# Patient Record
Sex: Male | Born: 1995 | Race: White | Hispanic: No | Smoking: Current every day smoker
Health system: Southern US, Community
[De-identification: ages and names within clinical notes are randomized; demographics above are authoritative.]

## PROBLEM LIST (undated history)

## (undated) DIAGNOSIS — R569 Unspecified convulsions: Secondary | ICD-10-CM

## (undated) HISTORY — PX: OTHER SURGICAL HISTORY: SHX169

## (undated) HISTORY — PX: VENTRICULOPERITONEAL SHUNT: SHX204

---

## 2016-10-04 ENCOUNTER — Other Ambulatory Visit: Payer: Self-pay | Admitting: Occupational Medicine

## 2016-10-04 ENCOUNTER — Ambulatory Visit: Payer: Self-pay

## 2016-10-04 DIAGNOSIS — Z0000 Encounter for general adult medical examination without abnormal findings: Secondary | ICD-10-CM

## 2019-11-07 ENCOUNTER — Emergency Department (HOSPITAL_COMMUNITY)

## 2019-11-07 ENCOUNTER — Emergency Department (HOSPITAL_COMMUNITY)
Admission: EM | Admit: 2019-11-07 | Discharge: 2019-11-07 | Attending: Emergency Medicine | Admitting: Emergency Medicine

## 2019-11-07 ENCOUNTER — Encounter (HOSPITAL_COMMUNITY): Payer: Self-pay

## 2019-11-07 ENCOUNTER — Other Ambulatory Visit: Payer: Self-pay

## 2019-11-07 DIAGNOSIS — Z20828 Contact with and (suspected) exposure to other viral communicable diseases: Secondary | ICD-10-CM | POA: Insufficient documentation

## 2019-11-07 DIAGNOSIS — R1084 Generalized abdominal pain: Secondary | ICD-10-CM | POA: Insufficient documentation

## 2019-11-07 DIAGNOSIS — F17210 Nicotine dependence, cigarettes, uncomplicated: Secondary | ICD-10-CM | POA: Insufficient documentation

## 2019-11-07 DIAGNOSIS — R569 Unspecified convulsions: Secondary | ICD-10-CM | POA: Diagnosis not present

## 2019-11-07 DIAGNOSIS — R112 Nausea with vomiting, unspecified: Secondary | ICD-10-CM | POA: Insufficient documentation

## 2019-11-07 DIAGNOSIS — Z982 Presence of cerebrospinal fluid drainage device: Secondary | ICD-10-CM | POA: Insufficient documentation

## 2019-11-07 HISTORY — DX: Unspecified convulsions: R56.9

## 2019-11-07 LAB — CBC WITH DIFFERENTIAL/PLATELET
Abs Immature Granulocytes: 0.05 10*3/uL (ref 0.00–0.07)
Basophils Absolute: 0.1 10*3/uL (ref 0.0–0.1)
Basophils Relative: 1 %
Eosinophils Absolute: 0.1 10*3/uL (ref 0.0–0.5)
Eosinophils Relative: 1 %
HCT: 48.1 % (ref 39.0–52.0)
Hemoglobin: 16.1 g/dL (ref 13.0–17.0)
Immature Granulocytes: 0 %
Lymphocytes Relative: 30 %
Lymphs Abs: 4 10*3/uL (ref 0.7–4.0)
MCH: 30.5 pg (ref 26.0–34.0)
MCHC: 33.5 g/dL (ref 30.0–36.0)
MCV: 91.1 fL (ref 80.0–100.0)
Monocytes Absolute: 0.8 10*3/uL (ref 0.1–1.0)
Monocytes Relative: 6 %
Neutro Abs: 8.3 10*3/uL — ABNORMAL HIGH (ref 1.7–7.7)
Neutrophils Relative %: 62 %
Platelets: 396 10*3/uL (ref 150–400)
RBC: 5.28 MIL/uL (ref 4.22–5.81)
RDW: 13.3 % (ref 11.5–15.5)
WBC: 13.3 10*3/uL — ABNORMAL HIGH (ref 4.0–10.5)
nRBC: 0 % (ref 0.0–0.2)

## 2019-11-07 LAB — COMPREHENSIVE METABOLIC PANEL
ALT: 40 U/L (ref 0–44)
AST: 20 U/L (ref 15–41)
Albumin: 4.2 g/dL (ref 3.5–5.0)
Alkaline Phosphatase: 66 U/L (ref 38–126)
Anion gap: 12 (ref 5–15)
BUN: 16 mg/dL (ref 6–20)
CO2: 21 mmol/L — ABNORMAL LOW (ref 22–32)
Calcium: 9.3 mg/dL (ref 8.9–10.3)
Chloride: 104 mmol/L (ref 98–111)
Creatinine, Ser: 0.75 mg/dL (ref 0.61–1.24)
GFR calc Af Amer: >60 mL/min (ref >60)
GFR calc non Af Amer: >60 mL/min (ref >60)
Glucose, Bld: 97 mg/dL (ref 70–99)
Potassium: 3.7 mmol/L (ref 3.5–5.1)
Sodium: 137 mmol/L (ref 135–145)
Total Bilirubin: 0.7 mg/dL (ref 0.3–1.2)
Total Protein: 8.4 g/dL — ABNORMAL HIGH (ref 6.5–8.1)

## 2019-11-07 LAB — MAGNESIUM: Magnesium: 2.2 mg/dL (ref 1.7–2.4)

## 2019-11-07 LAB — LIPASE, BLOOD: Lipase: 38 U/L (ref 11–51)

## 2019-11-07 MED ORDER — ONDANSETRON 4 MG PO TBDP: 4.0000 mg | ORAL_TABLET | Freq: Three times a day (TID) | ORAL | 1 refills | Status: AC | PRN

## 2019-11-07 MED ORDER — SODIUM CHLORIDE 0.9 % IV BOLUS
1000.0000 mL | Freq: Once | INTRAVENOUS | Status: AC
  Administered 2019-11-07: 1000 mL via INTRAVENOUS

## 2019-11-07 MED ORDER — ONDANSETRON HCL 4 MG/2ML IJ SOLN
4.0000 mg | Freq: Once | INTRAMUSCULAR | Status: AC
  Administered 2019-11-07: 4 mg via INTRAVENOUS
  Filled 2019-11-07: qty 2

## 2019-11-07 MED ORDER — SODIUM CHLORIDE 0.9 % IV SOLN
INTRAVENOUS | Status: DC
  Administered 2019-11-07: 17:00:00 via INTRAVENOUS

## 2019-11-07 MED ORDER — IOHEXOL 300 MG/ML  SOLN
100.0000 mL | Freq: Once | INTRAMUSCULAR | Status: AC | PRN
  Administered 2019-11-07: 100 mL via INTRAVENOUS

## 2019-11-07 NOTE — Discharge Instructions (Signed)
Work-up in the emergency department clued CT head CT abdomen pelvis labs.  Also retested for Covid.  Results still pending.  No significant abnormalities patient feeling better.  Patient able to eat some snacks.  Would recommend continuing some antinausea medicine like Zofran.  Apparently had some type of seizure-like activity.  But is fine here.  If he has recurrent seizures he will need to be reevaluated.

## 2019-11-07 NOTE — ED Provider Notes (Signed)
[facility] EMERGENCY DEPARTMENT Provider Note   CSN: [id number] Arrival date & time: 11/07/19  1556     History   Chief Complaint Chief Complaint  Patient presents with  . Seizures    HPI Todd Beck is a 23 y.o. male.     Patient brought in from [place] [facility].  Patient's been in the jail for the past 4 days but he was transferred from another facility.  Patient reports nausea vomiting and some abdominal discomfort states had nausea and vomiting starting yesterday.  And supposedly had 6 back-to-back seizures.  Patient stated last time he had a seizure to me was many years ago.  Patient as a child had history of hydrocephalus.  And does have a shunt.  He denies any fevers.  Any significant headache.  Denies biting his tongue.  Denies any incontinence.  Main complaint currently is nausea.  Patient had a negative Covid test about 2 weeks ago.  But none recently.     Past Medical History:  Diagnosis Date  . Seizures (HCC)     There are no active problems to display for this patient.   Past Surgical History:  Procedure Laterality Date  . uv shunt     12 revisions        Home Medications    Prior to Admission medications   Medication Sig Start Date End Date Taking? Authorizing Provider  ondansetron (ZOFRAN ODT) 4 MG disintegrating tablet Take 1 tablet (4 mg total) by mouth every 8 (eight) hours as needed. 11/07/19   Vanetta Mulders, MD    Family History No family history on file.  Social History Social History   Tobacco Use  . Smoking status: Current Every Day Smoker  . Smokeless tobacco: Never Used  Substance Use Topics  . Alcohol use: Never    Frequency: Never  . Drug use: Yes    Types: IV    Comment: heroin     Allergies   Penicillins and Zithromax [azithromycin]   Review of Systems Review of Systems  Constitutional: Negative for chills and fever.  HENT: Negative for congestion, rhinorrhea and sore throat.   Eyes: Negative for visual  disturbance.  Respiratory: Negative for cough and shortness of breath.   Cardiovascular: Negative for chest pain and leg swelling.  Gastrointestinal: Positive for abdominal pain, nausea and vomiting. Negative for diarrhea.  Genitourinary: Negative for dysuria.  Musculoskeletal: Negative for back pain and neck pain.  Skin: Negative for rash.  Neurological: Positive for seizures. Negative for dizziness, light-headedness and headaches.  Hematological: Does not bruise/bleed easily.  Psychiatric/Behavioral: Negative for confusion.     Physical Exam Updated Vital Signs BP 129/72   Pulse 75   Temp 98.1 F (36.7 C) (Oral)   Resp 18   Ht 1.829 m (6')   Wt 72.6 kg   SpO2 100%   BMI 21.70 kg/m   Physical Exam Vitals signs and nursing note reviewed.  Constitutional:      Appearance: Normal appearance. He is well-developed.  HENT:     Head: Normocephalic and atraumatic.     Mouth/Throat:     Mouth: Mucous membranes are dry.  Eyes:     Extraocular Movements: Extraocular movements intact.     Conjunctiva/sclera: Conjunctivae normal.     Pupils: Pupils are equal, round, and reactive to light.  Neck:     Musculoskeletal: Normal range of motion and neck supple.  Cardiovascular:     Rate and Rhythm: Normal rate and regular rhythm.  Heart sounds: No murmur.  Pulmonary:     Effort: Pulmonary effort is normal. No respiratory distress.     Breath sounds: Normal breath sounds.  Abdominal:     Palpations: Abdomen is soft.     Tenderness: There is abdominal tenderness.     Comments: Mild diffuse tenderness little increased right lower quadrant.  Musculoskeletal: Normal range of motion.        General: No swelling.  Skin:    General: Skin is warm and dry.     Capillary Refill: Capillary refill takes less than 2 seconds.  Neurological:     Mental Status: He is alert and oriented to person, place, and time.     Cranial Nerves: No cranial nerve deficit.     Sensory: No sensory deficit.      Motor: No weakness.     Coordination: Coordination normal.     Gait: Gait normal.      ED Treatments / Results  Labs (all labs ordered are listed, but only abnormal results are displayed) Labs Reviewed  CBC WITH DIFFERENTIAL/PLATELET - Abnormal; Notable for the following components:      Result Value   WBC 13.3 (*)    Neutro Abs 8.3 (*)    All other components within normal limits  COMPREHENSIVE METABOLIC PANEL - Abnormal; Notable for the following components:   CO2 21 (*)    Total Protein 8.4 (*)    All other components within normal limits  SARS CORONAVIRUS 2 (TAT 6-24 HRS)  MAGNESIUM  LIPASE, BLOOD    EKG None  Radiology Ct Head Wo Contrast  Result Date: 11/07/2019 CLINICAL DATA:  Nontraumatic seizure, has been in jail for past 4 days, nausea, vomiting then seizure, past history of seizures most recently 1 month ago, history IV drug abuse last 4 days ago, history of shunt, smoker EXAM: CT HEAD WITHOUT CONTRAST TECHNIQUE: Contiguous axial images were obtained from the base of the skull through the vertex without intravenous contrast. Sagittal and coronal MPR images reconstructed from axial data set. COMPARISON:  None FINDINGS: Brain: VP shunt via RIGHT frontal approach with tip at anterior horn LEFT lateral ventricle. Decompressed ventricular system no midline shift or mass effect. Normal appearance of brain parenchyma. No intracranial hemorrhage, mass lesion, or evidence of acute infarction. No extra-axial fluid collections. Vascular: No hyperdense vessels Skull: Intact Sinuses/Orbits: Clear Other: N/A IMPRESSION: No acute intracranial abnormalities. Electronically Signed   By: Ulyses SouthwardMark  Boles M.D.   On: 11/07/2019 18:36   Ct Abdomen Pelvis W Contrast  Result Date: 11/07/2019 CLINICAL DATA:  23 year old male with abdominal pain. Concern for acute appendicitis. EXAM: CT ABDOMEN AND PELVIS WITH CONTRAST TECHNIQUE: Multidetector CT imaging of the abdomen and pelvis was performed  using the standard protocol following bolus administration of intravenous contrast. CONTRAST:  100mL OMNIPAQUE IOHEXOL 300 MG/ML  SOLN COMPARISON:  None. FINDINGS: Lower chest: The visualized lung bases are clear. No intra-abdominal free air. There is a small free fluid within the pelvis. Hepatobiliary: The liver is unremarkable. Mild intrahepatic biliary ductal dilatation versus periportal edema. The gallbladder is unremarkable. Pancreas: Unremarkable. No pancreatic ductal dilatation or surrounding inflammatory changes. Spleen: Normal in size without focal abnormality. Adrenals/Urinary Tract: The adrenal glands are unremarkable. The kidneys, visualized ureters, and urinary bladder appear unremarkable. Stomach/Bowel: Mild diffuse thickened appearance of the colon, likely related to underdistention. Colitis is less likely. Clinical correlation is recommended. There is no bowel obstruction. The appendix is normal. Vascular/Lymphatic: The abdominal aorta and IVC are unremarkable. No  portal venous gas. There is no adenopathy. Reproductive: The prostate and seminal vesicles are grossly unremarkable. No pelvic mass. Other: Partially visualized VP shunt along the right anterior abdomen with tip in the left flank anterior to the inferior pole of the left kidney. No loculated fluid collection. Musculoskeletal: No acute or significant osseous findings. IMPRESSION: Underdistention of the colon versus less likely mild colitis. Clinical correlation is recommended. No bowel obstruction. Normal appendix. Electronically Signed   By: Anner Crete M.D.   On: 11/07/2019 18:37   Dg Chest Port 1 View  Result Date: 11/07/2019 CLINICAL DATA:  Seizure, abdominal pain, nausea, vomiting EXAM: PORTABLE CHEST 1 VIEW COMPARISON:  Portable exam 1657 hours compared to 10/04/2016 FINDINGS: VP shunt tubing traverses RIGHT chest. Normal heart size, mediastinal contours, and pulmonary vascularity. Lungs mildly hyperinflated but clear. No  acute infiltrate, pleural effusion, or pneumothorax. Osseous structures unremarkable. IMPRESSION: No acute abnormalities. Electronically Signed   By: Lavonia Dana M.D.   On: 11/07/2019 17:06    Procedures Procedures (including critical care time)  Medications Ordered in ED Medications  0.9 %  sodium chloride infusion ( Intravenous Stopped 11/07/19 1920)  sodium chloride 0.9 % bolus 1,000 mL (0 mLs Intravenous Stopped 11/07/19 1920)  ondansetron (ZOFRAN) injection 4 mg (4 mg Intravenous Given 11/07/19 1654)  iohexol (OMNIPAQUE) 300 MG/ML solution 100 mL (100 mLs Intravenous Contrast Given 11/07/19 1805)  ondansetron (ZOFRAN) injection 4 mg (4 mg Intravenous Given 11/07/19 1956)     Initial Impression / Assessment and Plan / ED Course  I have reviewed the triage vital signs and the nursing notes.  Pertinent labs & imaging results that were available during my care of the patient were reviewed by me and considered in my medical decision making (see chart for details).        Work-up in the emergency department negative head CT negative CT abdomen and pelvis.  Patient may have a gastroenteritis.  Not sure to make of the seizure activity supposedly 6 at once.  But no definite evidence of that.  Patient's been fine here.  CT head was negative no evidence of any hydrocephalus and the shunt tubing seems to be fine in that area.  Labs without significant abnormality.  Patient treated here with some fluids antinausea medicine and patient feeling better and was able to eat a snack.  Patient's neuro exam without any abnormalities at all.  Not clear whether patient truly has a true history of seizures not on antiseizure meds says he never was.  Will call out seizure-like activity for now.  And treat him for nausea as if he had a gastritis.   Final Clinical Impressions(s) / ED Diagnoses   Final diagnoses:  Seizure-like activity ([place])  Generalized abdominal pain  Non-intractable vomiting with nausea,  unspecified vomiting type    ED Discharge Orders         Ordered    ondansetron (ZOFRAN ODT) 4 MG disintegrating tablet  Every 8 hours PRN     11/07/19 1942           Fredia Sorrow, MD 11/07/19 2008

## 2019-11-07 NOTE — ED Triage Notes (Signed)
Pt reports has been in jail for the past 4 days.   PT Reports nausea, vomiting, and had seizure. Reports history of seizures.  Prior to today, pt's last seizure was approx 1 month ago.  Pt has history of iv drug use.  Last used 4 days ago.   Pt presently alert, oriented.

## 2019-11-07 NOTE — ED Notes (Signed)
Pt given crackers, peanut butter, and water per request- ok per Dr Rogene Houston

## 2019-11-08 ENCOUNTER — Other Ambulatory Visit: Payer: Self-pay

## 2019-11-08 ENCOUNTER — Emergency Department (HOSPITAL_COMMUNITY)
Admission: EM | Admit: 2019-11-08 | Discharge: 2019-11-09 | Disposition: A | Discharge status: Home / Self Care | Source: Home / Self Care | Attending: Emergency Medicine | Admitting: Emergency Medicine

## 2019-11-08 ENCOUNTER — Encounter (HOSPITAL_COMMUNITY): Payer: Self-pay | Admitting: Emergency Medicine

## 2019-11-08 ENCOUNTER — Emergency Department (HOSPITAL_COMMUNITY)

## 2019-11-08 ENCOUNTER — Emergency Department (HOSPITAL_COMMUNITY)
Admission: EM | Admit: 2019-11-08 | Discharge: 2019-11-08 | Disposition: A | Discharge status: Home / Self Care | Attending: Emergency Medicine | Admitting: Emergency Medicine

## 2019-11-08 DIAGNOSIS — F445 Conversion disorder with seizures or convulsions: Secondary | ICD-10-CM

## 2019-11-08 DIAGNOSIS — R569 Unspecified convulsions: Secondary | ICD-10-CM | POA: Insufficient documentation

## 2019-11-08 DIAGNOSIS — F17210 Nicotine dependence, cigarettes, uncomplicated: Secondary | ICD-10-CM | POA: Diagnosis not present

## 2019-11-08 LAB — BASIC METABOLIC PANEL
Anion gap: 9 (ref 5–15)
BUN: 15 mg/dL (ref 6–20)
CO2: 23 mmol/L (ref 22–32)
Calcium: 8.8 mg/dL — ABNORMAL LOW (ref 8.9–10.3)
Chloride: 106 mmol/L (ref 98–111)
Creatinine, Ser: 0.78 mg/dL (ref 0.61–1.24)
GFR calc Af Amer: >60 mL/min (ref >60)
GFR calc non Af Amer: >60 mL/min (ref >60)
Glucose, Bld: 92 mg/dL (ref 70–99)
Potassium: 3.9 mmol/L (ref 3.5–5.1)
Sodium: 138 mmol/L (ref 135–145)

## 2019-11-08 LAB — SARS CORONAVIRUS 2 (TAT 6-24 HRS): SARS Coronavirus 2: NEGATIVE

## 2019-11-08 LAB — CK: Total CK: 40 U/L — ABNORMAL LOW (ref 49–397)

## 2019-11-08 MED ORDER — LEVETIRACETAM 500 MG PO TABS: 500.0000 mg | ORAL_TABLET | Freq: Two times a day (BID) | ORAL | 0 refills | Status: DC

## 2019-11-08 MED ORDER — LEVETIRACETAM IN NACL 1000 MG/100ML IV SOLN
1000.0000 mg | Freq: Once | INTRAVENOUS | Status: AC
  Administered 2019-11-08: 1000 mg via INTRAVENOUS
  Filled 2019-11-08: qty 100

## 2019-11-08 NOTE — ED Triage Notes (Signed)
Pt brought in from the jail for "seizures" from heroin withdrawal. Here for same yesterday. No seizures witnessed by ED staff yesterday or today. None witnessed by ems. Last used heroin 5 days ago.

## 2019-11-08 NOTE — ED Triage Notes (Signed)
Patient brought in by [facility]. Patient from the jail was here earlier for seizures. Per officers patient had 5 seizures today after he returned to the jail. EMS states no prior HX of seizures until today. Patient does have a hx of heroin use. EMS states patient has 2 seizures en-route to the hospital. CBG was 120. EMS gave 2.5 mg's of versed. Patient also fell at the jail during one of the seizures and has a C-collar in place complaining of neck pain.

## 2019-11-08 NOTE — ED Notes (Signed)
Spoke with Nyoka Cowden PA. Cancelled neuro consult at this time.

## 2019-11-08 NOTE — Discharge Instructions (Addendum)
Please take Keppra, as prescribed.  Return to the ED or seek medical attention for any new or worsening symptoms.  You will need ongoing evaluation and management of your seizure disorder, perhaps by a neurologist.  I have put in a referral to see neurologist Dr. Merlene Laughter, pending your limitations. Please follow-up with them.  Otherwise please follow-up with the medical personnel at your facility.

## 2019-11-08 NOTE — ED Provider Notes (Signed)
[facility] EMERGENCY DEPARTMENT Provider Note   CSN: [id number] Arrival date & time: 11/08/19  2209     History   Chief Complaint Chief Complaint  Patient presents with  . Seizures    fall    HPI Todd Beck is a 23 y.o. male.     Patient has had some passing out and shaking episodes at prison today.  He was seen in emergency department and was sent home on Keppra  The history is provided by the patient. No language interpreter was used.  Seizures Seizure activity on arrival: no   Seizure type:  Partial simple Preceding symptoms: no sensation of an aura present   Initial focality:  None Episode characteristics: abnormal movements   Postictal symptoms: no confusion   Return to baseline: no   Severity:  Mild   Past Medical History:  Diagnosis Date  . Seizures (HCC)     There are no active problems to display for this patient.   Past Surgical History:  Procedure Laterality Date  . uv shunt     12 revisions  . VENTRICULOPERITONEAL SHUNT          Home Medications    Prior to Admission medications   Medication Sig Start Date End Date Taking? Authorizing Provider  levETIRAcetam (KEPPRA) 500 MG tablet Take 1 tablet (500 mg total) by mouth 2 (two) times daily. 11/08/19 12/08/19  Lorelee New, PA-C  ondansetron (ZOFRAN ODT) 4 MG disintegrating tablet Take 1 tablet (4 mg total) by mouth every 8 (eight) hours as needed. 11/07/19   Vanetta Mulders, MD    Family History History reviewed. No pertinent family history.  Social History Social History   Tobacco Use  . Smoking status: Current Every Day Smoker  . Smokeless tobacco: Never Used  Substance Use Topics  . Alcohol use: Never    Frequency: Never  . Drug use: Yes    Types: IV    Comment: heroin     Allergies   Penicillins and Zithromax [azithromycin]   Review of Systems Review of Systems  Constitutional: Negative for appetite change and fatigue.  HENT: Negative for congestion, ear  discharge and sinus pressure.        Headache  Eyes: Negative for discharge.  Respiratory: Negative for cough.   Cardiovascular: Negative for chest pain.  Gastrointestinal: Negative for abdominal pain and diarrhea.  Genitourinary: Negative for frequency and hematuria.  Musculoskeletal: Negative for back pain.  Skin: Negative for rash.  Neurological: Positive for seizures. Negative for headaches.  Psychiatric/Behavioral: Negative for hallucinations.     Physical Exam Updated Vital Signs BP 121/74 (BP Location: Right Arm)   Pulse (!) 59   Temp 98.4 F (36.9 C) (Oral)   Resp 12   Ht 6' (1.829 m)   Wt 72.6 kg   SpO2 100%   BMI 21.71 kg/m   Physical Exam Vitals signs and nursing note reviewed.  Constitutional:      Appearance: He is well-developed.  HENT:     Head: Normocephalic.     Nose: Nose normal.  Eyes:     General: No scleral icterus.    Conjunctiva/sclera: Conjunctivae normal.  Neck:     Musculoskeletal: Neck supple.     Thyroid: No thyromegaly.  Cardiovascular:     Rate and Rhythm: Normal rate and regular rhythm.     Heart sounds: No murmur. No friction rub. No gallop.   Pulmonary:     Breath sounds: No stridor. No wheezing or rales.  Chest:     Chest wall: No tenderness.  Abdominal:     General: There is no distension.     Tenderness: There is no abdominal tenderness. There is no rebound.  Musculoskeletal: Normal range of motion.  Lymphadenopathy:     Cervical: No cervical adenopathy.  Skin:    Findings: No erythema or rash.  Neurological:     Mental Status: He is alert and oriented to person, place, and time.     Motor: No abnormal muscle tone.     Coordination: Coordination normal.  Psychiatric:        Behavior: Behavior normal.      ED Treatments / Results  Labs (all labs ordered are listed, but only abnormal results are displayed) Labs Reviewed  BASIC METABOLIC PANEL - Abnormal; Notable for the following components:      Result Value    Calcium 8.8 (*)    All other components within normal limits  CK - Abnormal; Notable for the following components:   Total CK 40 (*)    All other components within normal limits    EKG None  Radiology Ct Head Wo Contrast  Result Date: 11/07/2019 CLINICAL DATA:  Nontraumatic seizure, has been in jail for past 4 days, nausea, vomiting then seizure, past history of seizures most recently 1 month ago, history IV drug abuse last 4 days ago, history of shunt, smoker EXAM: CT HEAD WITHOUT CONTRAST TECHNIQUE: Contiguous axial images were obtained from the base of the skull through the vertex without intravenous contrast. Sagittal and coronal MPR images reconstructed from axial data set. COMPARISON:  None FINDINGS: Brain: VP shunt via RIGHT frontal approach with tip at anterior horn LEFT lateral ventricle. Decompressed ventricular system no midline shift or mass effect. Normal appearance of brain parenchyma. No intracranial hemorrhage, mass lesion, or evidence of acute infarction. No extra-axial fluid collections. Vascular: No hyperdense vessels Skull: Intact Sinuses/Orbits: Clear Other: N/A IMPRESSION: No acute intracranial abnormalities. Electronically Signed   By: Lavonia Dana M.D.   On: 11/07/2019 18:36   Ct Abdomen Pelvis W Contrast  Result Date: 11/07/2019 CLINICAL DATA:  23 year old male with abdominal pain. Concern for acute appendicitis. EXAM: CT ABDOMEN AND PELVIS WITH CONTRAST TECHNIQUE: Multidetector CT imaging of the abdomen and pelvis was performed using the standard protocol following bolus administration of intravenous contrast. CONTRAST:  131mL OMNIPAQUE IOHEXOL 300 MG/ML  SOLN COMPARISON:  None. FINDINGS: Lower chest: The visualized lung bases are clear. No intra-abdominal free air. There is a small free fluid within the pelvis. Hepatobiliary: The liver is unremarkable. Mild intrahepatic biliary ductal dilatation versus periportal edema. The gallbladder is unremarkable. Pancreas:  Unremarkable. No pancreatic ductal dilatation or surrounding inflammatory changes. Spleen: Normal in size without focal abnormality. Adrenals/Urinary Tract: The adrenal glands are unremarkable. The kidneys, visualized ureters, and urinary bladder appear unremarkable. Stomach/Bowel: Mild diffuse thickened appearance of the colon, likely related to underdistention. Colitis is less likely. Clinical correlation is recommended. There is no bowel obstruction. The appendix is normal. Vascular/Lymphatic: The abdominal aorta and IVC are unremarkable. No portal venous gas. There is no adenopathy. Reproductive: The prostate and seminal vesicles are grossly unremarkable. No pelvic mass. Other: Partially visualized VP shunt along the right anterior abdomen with tip in the left flank anterior to the inferior pole of the left kidney. No loculated fluid collection. Musculoskeletal: No acute or significant osseous findings. IMPRESSION: Underdistention of the colon versus less likely mild colitis. Clinical correlation is recommended. No bowel obstruction. Normal appendix. Electronically Signed  By: Elgie CollardArash  Radparvar M.D.   On: 11/07/2019 18:37   Dg Chest Port 1 View  Result Date: 11/07/2019 CLINICAL DATA:  Seizure, abdominal pain, nausea, vomiting EXAM: PORTABLE CHEST 1 VIEW COMPARISON:  Portable exam 1657 hours compared to 10/04/2016 FINDINGS: VP shunt tubing traverses RIGHT chest. Normal heart size, mediastinal contours, and pulmonary vascularity. Lungs mildly hyperinflated but clear. No acute infiltrate, pleural effusion, or pneumothorax. Osseous structures unremarkable. IMPRESSION: No acute abnormalities. Electronically Signed   By: Ulyses SouthwardMark  Boles M.D.   On: 11/07/2019 17:06    Procedures Procedures (including critical care time)  Medications Ordered in ED Medications  levETIRAcetam (KEPPRA) IVPB 1000 mg/100 mL premix (1,000 mg Intravenous New Bag/Given 11/08/19 2253)     Initial Impression / Assessment and Plan / ED  Course  I have reviewed the triage vital signs and the nursing notes.  Pertinent labs & imaging results that were available during my care of the patient were reviewed by me and considered in my medical decision making (see chart for details).        Patient labs are unremarkable.  CT scan pending.  He is loaded with Keppra and will follow up with neurology.  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 Final Clinical Impressions(s) / ED Diagnoses   Final diagnoses:  None    ED Discharge Orders    None       Bethann BerkshireZammit, Chari Parmenter, MD 11/12/19 1026

## 2019-11-08 NOTE — ED Notes (Signed)
Patient did not get his Keppra filled because local pharmacy from the [facility] uses was closed.

## 2019-11-08 NOTE — ED Provider Notes (Signed)
[facility] EMERGENCY DEPARTMENT Provider Note   CSN: [id number] Arrival date & time: 11/08/19  1240     History   Chief Complaint Chief Complaint  Patient presents with   Seizures    HPI Banner Huckaba is a 23 y.o. male who presents to the ED from prison via EMS for seizure activity.  Patient reportedly had several seizures as a child that necessitated hospital evaluation and admission.  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  He reports that he typically endure 3-5 episodes of tonic-clonic seizures.  Obtained history from prison officer who reports that he observed one of his 3 seizures today.  It lasted approximately 1 minute and consisted of full body convulsions.  Currently, patient denies any headache or dizziness, chest pain or shortness of breath, nausea, or injury.  He denies any incontinence or tongue biting with his seizure episodes.  When asked why he never sought evaluation and management for his seizure activity, patient reports that he always had to take care of himself and his parents did not really look out for him.  He was arrested 11/03/2019 and is currently still in jail.     HPI  Past Medical History:  Diagnosis Date   Seizures ([place])     There are no active problems to display for this patient.   Past Surgical History:  Procedure Laterality Date   uv shunt     12 revisions   VENTRICULOPERITONEAL SHUNT          Home Medications    Prior to Admission medications   Medication Sig Start Date End Date Taking? Authorizing Provider  levETIRAcetam (KEPPRA) 500 MG tablet Take 1 tablet (500 mg total) by mouth 2 (two) times daily. 11/08/19 12/08/19  Corena Herter, PA-C  ondansetron (ZOFRAN ODT) 4 MG disintegrating tablet Take 1 tablet (4 mg total) by mouth every 8 (eight) hours as needed. 11/07/19   Fredia Sorrow, MD    Family History History reviewed. No pertinent family  history.  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    Physical Exam Updated Vital Signs BP 112/82 (BP Location: Right Arm)    Pulse (!) 59    Temp 98.3 F (36.8 C) (Oral)    Resp 12    Ht 5\' 7"  (1.702 m)    Wt 72.6 kg    SpO2 100%    BMI 25.07 kg/m   Physical Exam Vitals signs and nursing note reviewed. Exam conducted with a chaperone present.  Constitutional:      Appearance: Normal appearance.  HENT:     Head: Normocephalic and atraumatic.  Eyes:     General: No scleral icterus.    Extraocular Movements: Extraocular movements intact.     Conjunctiva/sclera: Conjunctivae normal.     Pupils: Pupils are equal, round, and reactive to light.  Neck:     Musculoskeletal: Normal range of motion and neck supple. No neck rigidity or muscular tenderness.  Cardiovascular:     Rate and Rhythm: Normal rate and regular  rhythm.  Pulmonary:     Effort: Pulmonary effort is normal.  Skin:    General: Skin is dry.     Capillary Refill: Capillary refill takes less than 2 seconds.  Neurological:     General: No focal deficit present.     Mental Status: He is alert and oriented to person, place, and time.     GCS: GCS eye subscore is 4. GCS verbal subscore is 5. GCS motor subscore is 6.     Cranial Nerves: No cranial nerve deficit.     Sensory: No sensory deficit.     Motor: No weakness.     Coordination: Coordination normal.     Gait: Gait normal.  Psychiatric:        Mood and Affect: Mood normal.        Behavior: Behavior normal.        Thought Content: Thought content normal.      ED Treatments / Results  Labs (all labs ordered are  listed, but only abnormal results are displayed) Labs Reviewed - No data to display  EKG None  Radiology Ct Head Wo Contrast  Result Date: 11/07/2019 CLINICAL DATA:  Nontraumatic seizure, has been in jail for past 4 days, nausea, vomiting then seizure, past history of seizures most recently 1 month ago, history IV drug abuse last 4 days ago, history of shunt, smoker EXAM: CT HEAD WITHOUT CONTRAST TECHNIQUE: Contiguous axial images were obtained from the base of the skull through the vertex without intravenous contrast. Sagittal and coronal MPR images reconstructed from axial data set. COMPARISON:  None FINDINGS: Brain: VP shunt via RIGHT frontal approach with tip at anterior horn LEFT lateral ventricle. Decompressed ventricular system no midline shift or mass effect. Normal appearance of brain parenchyma. No intracranial hemorrhage, mass lesion, or evidence of acute infarction. No extra-axial fluid collections. Vascular: No hyperdense vessels Skull: Intact Sinuses/Orbits: Clear Other: N/A IMPRESSION: No acute intracranial abnormalities. Electronically Signed   By: Ulyses SouthwardMark  Boles M.D.   On: 11/07/2019 18:36   Ct Abdomen Pelvis W Contrast  Result Date: 11/07/2019 CLINICAL DATA:  23 year old male with abdominal pain. Concern for acute appendicitis. EXAM: CT ABDOMEN AND PELVIS WITH CONTRAST TECHNIQUE: Multidetector CT imaging of the abdomen and pelvis was performed using the standard protocol following bolus administration of intravenous contrast. CONTRAST:  100mL OMNIPAQUE IOHEXOL 300 MG/ML  SOLN COMPARISON:  None. FINDINGS: Lower chest: The visualized lung bases are clear. No intra-abdominal free air. There is a small free fluid within the pelvis. Hepatobiliary: The liver is unremarkable. Mild intrahepatic biliary ductal dilatation versus periportal edema. The gallbladder is unremarkable. Pancreas: Unremarkable. No pancreatic ductal dilatation or surrounding inflammatory changes. Spleen: Normal in size  without focal abnormality. Adrenals/Urinary Tract: The adrenal glands are unremarkable. The kidneys, visualized ureters, and urinary bladder appear unremarkable. Stomach/Bowel: Mild diffuse thickened appearance of the colon, likely related to underdistention. Colitis is less likely. Clinical correlation is recommended. There is no bowel obstruction. The appendix is normal. Vascular/Lymphatic: The abdominal aorta and IVC are unremarkable. No portal venous gas. There is no adenopathy. Reproductive: The prostate and seminal vesicles are grossly unremarkable. No pelvic mass. Other: Partially visualized VP shunt along the right anterior abdomen with tip in the left flank anterior to the inferior pole of the left kidney. No loculated fluid collection. Musculoskeletal: No acute or significant osseous findings. IMPRESSION: Underdistention of the colon versus less likely mild colitis. Clinical correlation is recommended. No bowel obstruction. Normal appendix. Electronically Signed   By: Burtis JunesArash  Radparvar M.D.   On: 11/07/2019 18:37   Dg Chest Port 1 View  Result Date: 11/07/2019 CLINICAL DATA:  Seizure, abdominal pain, nausea, vomiting EXAM: PORTABLE CHEST 1 VIEW COMPARISON:  Portable exam 1657 hours compared to 10/04/2016 FINDINGS: VP shunt tubing traverses RIGHT chest. Normal heart size, mediastinal contours, and pulmonary vascularity. Lungs mildly hyperinflated but clear. No acute infiltrate, pleural effusion, or pneumothorax. Osseous structures unremarkable. IMPRESSION: No acute abnormalities. Electronically Signed   By: Ulyses Southward M.D.   On: 11/07/2019 17:06    Procedures Procedures (including critical care time)  Medications Ordered in ED Medications - No data to display   Initial Impression / Assessment and Plan / ED Course  I have reviewed the triage vital signs and the nursing notes.  Pertinent labs & imaging results that were available during my care of the patient were reviewed by me and  considered in my medical decision making (see chart for details).        I personally reviewed patient's past medical record.  CT head was obtained yesterday for similar complaints in the ED and did not demonstrate any hydrocephalus or other intracranial abnormalities.  CT abdomen and pelvis was also obtained given his nausea symptoms which demonstrated possible evidence of gastroenteritis.  Patient was treated with antinausea medication and released.  While in the ED, patient denies any and all symptoms.  Reviewed labs and CT imaging from yesterday which was all reassuring.  No seizure activity while in our care today, but given his reported history of seizures and observed seizures in the jail system, discussed case with Dr. Adriana Simas and we will start him on Keppra 500 mg twice daily x30 days.  He will need to follow-up with a primary care provider either inside or outside of the jail system as well as potential neurologist for ongoing evaluation and management of his seizure disorder.  His seizure activity was observed in the jail system was tonic-clonic seizures lasting approximately 1 to 2 minutes in duration.  Evidently there is a medical team available at the jail should he enter status epilepticus.  In the meantime, he will have Keppra to reduce incidence of seizure.  Discussed very strict return precautions with patient and officers accompanying him.  Discussed plan with patient and he voiced understanding and is agreeable to the plan.    Final Clinical Impressions(s) / ED Diagnoses   Final diagnoses:  Seizures [facility])    ED Discharge Orders         Ordered    levETIRAcetam (KEPPRA) 500 MG tablet  2 times daily     11/08/19 1641           Lorelee New, PA-C 11/08/19 1659    Donnetta Hutching, MD 11/09/19 [phone]

## 2019-11-08 NOTE — Discharge Instructions (Addendum)
Follow up with dr. Merlene Laughter or another neurologist in 1 week

## 2019-11-09 MED ORDER — LEVETIRACETAM 500 MG PO TABS: 500.0000 mg | ORAL_TABLET | Freq: Two times a day (BID) | ORAL | 0 refills | Status: AC

## 2019-11-09 NOTE — ED Provider Notes (Signed)
I assumed care in signout to follow-up on CT imaging.  This is patient's third ER visit in the past 2 days for seizures.  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  He has no signs of any traumatic injury.  He is afebrile He has been started on Keppra here Due to multiple ER visits, I will consult neurology   EKG Interpretation  Date/Time:  Monday November 08 2019 22:18:37 EST Ventricular Rate:  54 PR Interval:    QRS Duration: 92 QT Interval:  438 QTC Calculation: 416 R Axis:   75 Text Interpretation: Sinus arrhythmia No previous ECGs available Confirmed by Ripley Fraise [phone]) on 11/09/2019 12:09:35 AM         Ripley Fraise, MD 11/09/19 [phone]

## 2019-11-09 NOTE — ED Notes (Signed)
Teleneuro complete. 

## 2019-11-09 NOTE — Consult Note (Signed)
[facility] Tele[facility]urology Consult Services  Stat Consult  Date of Service:   11/09/2019 00:38:14  Impression:     .  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 CT head from yesterday does not show any evidence of hydrocephalus, making malfunction of the shunt less likely, but cannot be completely excluded. He likely had breakthrough seizures today because he did not receive his Keppra while incarcerated.  CT HEAD: Showed No Acute Hemorrhage or Acute Core Infarct  Metrics: [facility] Notification Time: 11/09/2019 00:38:14 Stamp Time: 11/09/2019 00:38:14 Callback Response Time: 11/09/2019 00:57:00 Video Start Time: 11/09/2019 01:05:00 Video End Time: 11/09/2019 01:21:14  Our recommendations are outlined below.  Recommendations:     .  Start Keppra 500 mg BID     .  Already loaded with Keppra 1000 mg in ED     .  Ativan 2 mg IV prn seizure > 5 minutes     .  Seizure precautions     .  Recommend outpatient neuro workup with EEG and MRI w/wo contrast (with shunt series). Spoke with ED MD and he stated he spoke with the police officer from the jail who stated that they can have him see a neurologist within a reasonable time period. Would recommend outpatient neuro appointment in 1-2 weeks.    Disposition: Neurology Follow Up Recommended  Sign Out:     .  Discussed with Emergency Department Provider  ----------------------------------------------------------------------------------------------------  Chief Complaint: seizures  History of Present Illness: Patient is a 23 year old Male.  23 year old man with history of VP shunt who presents with seizure like activity. He began having seizures 1 year ago but they have becoming more frequent over the last several months. At first, he thought that they were related to  withdrawal from heroin but he has been incarcerated for 5 days and has not heroin, yet he continues to have seizures. He has been to the ED 3 times over the last few days. He states he has no warning before the seizures. Witnesses tell him that he suddenly falls to the ground or is found unconsciousness, with whole body generalized shaking. He has been told that the seizures last 2-5 minutes. He admits to slight urinary incontinence and mild tongue biting during the seizures. The next thing he remembers is waking up and feeling confused and disoriented. He had a CT of the head yesterday which did not show hydrocephalus. He was given Keppra at his second visit to the ED but was not given it in jail, which prompted another seizure today, resulting in a 3rd ED visit. He has not had his shunt evaluated since 2009.           Examination: BP(106/74), Pulse(57), Blood Glucose(92)  Neuro Exam:  General: Alert,Awake, Oriented to Time, Place, Person  Speech: Fluent:  Language: Intact:  Face: Symmetric:  Facial Sensation: Intact:  Visual Fields: Intact:  Extraocular Movements: Intact:  Motor Exam: No Drift:  Sensation: Reduced: LLE  Coordination: Intact:     Patient/Family was informed the Neurology Consult would happen via TeleHealth consult by way of interactive audio and video telecommunications and consented to receiving care in this manner.  Due to the immediate potential for life-threatening deterioration due to underlying acute neurologic illness, I spent 35 minutes providing critical care. This time includes time for face to face visit via telemedicine, review of medical records, imaging studies and  discussion of findings with providers, the patient and/or family.   Dr Vena Austria   [facility] [phone]  Case [id number]

## 2019-11-09 NOTE — ED Provider Notes (Signed)
Seen by teleneurology. They recommend outpatient MRI and EEG.  I discussed with the police officer from the jail, he reports they can arrange to have this done while patient is incarcerated Neurology also recommends Keppra 500 mg twice daily.  This will be re-prescribed Patient is otherwise awake alert in no acute distress. Also confirmed with radiology that the CT head that was performed would show any sort of acute abnormalities with the VP shunt.  Recent chest x-ray and CT abdomen pelvis confirm VP shunt is in appropriate position   Ripley Fraise, MD 11/09/19 [phone]

## 2021-01-04 IMAGING — CT CT CERVICAL SPINE W/O CM
3 of 4 series · 12 of 33 positions shown, 14 images · non-contrast
Comparison: 11/07/2019

CLINICAL DATA: Recent seizure activity with fall head injury,
initial encounter

EXAM:
CT HEAD WITHOUT CONTRAST
CT CERVICAL SPINE WITHOUT CONTRAST
TECHNIQUE: Multidetector CT imaging of the head and cervical spine was
performed following the standard protocol without intravenous
contrast. Multiplanar CT image reconstructions of the cervical spine
were also generated.

[Series 5: sagittal bone · sagittal · 0.27mm/px · 5 of 61 slices shown, 6 images]
[im 21/61  bone]
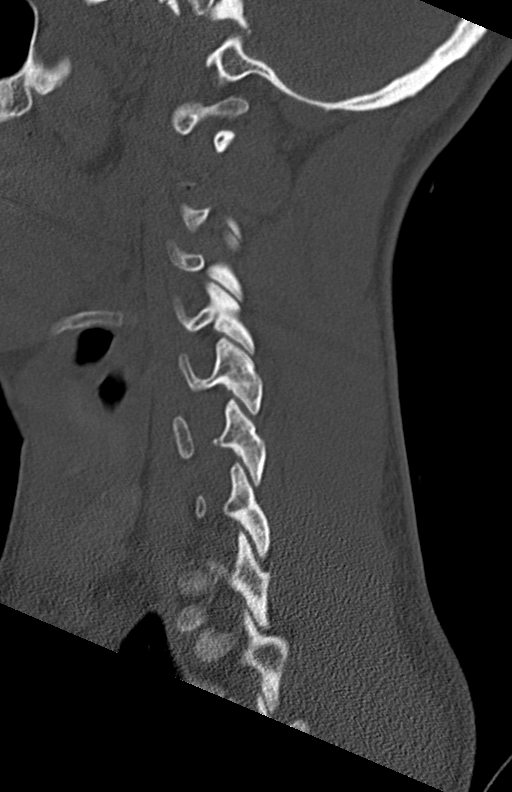
[im 26/61  bone]
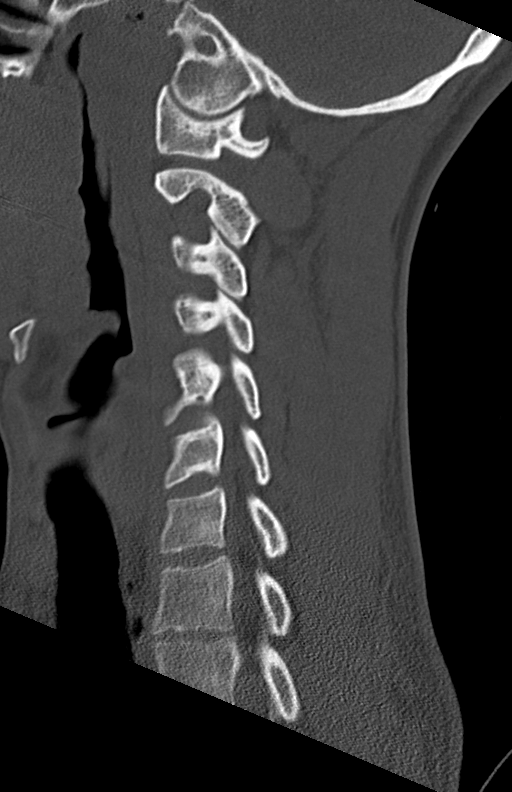
[im 31/61  soft-tissue]
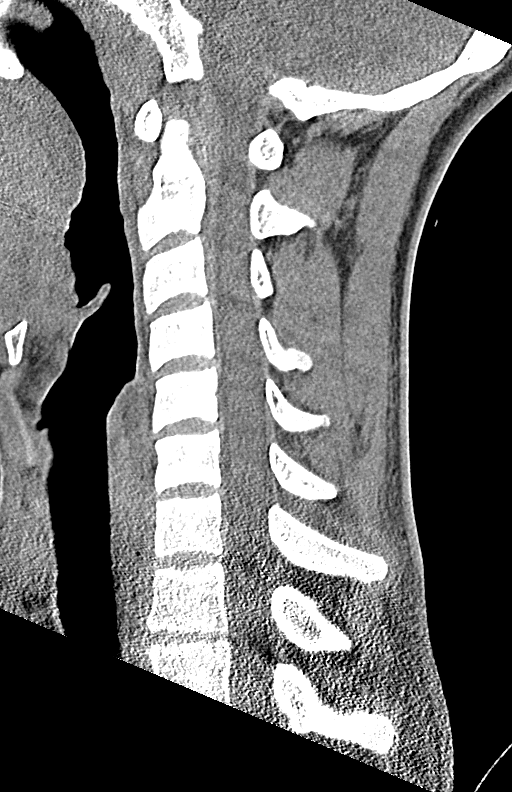
[im 31/61  bone]
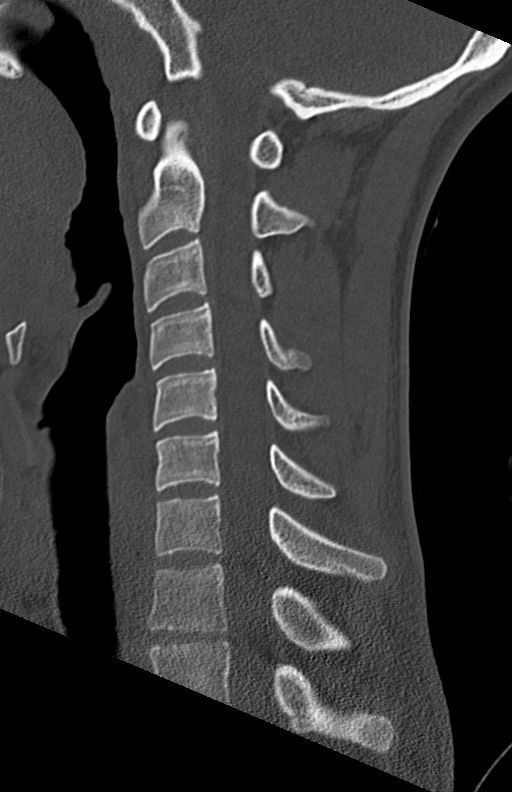
[im 36/61  bone]
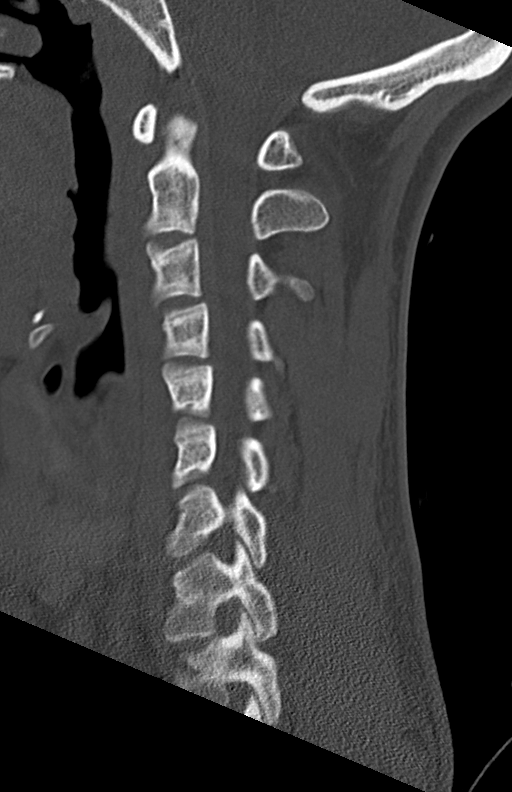
[im 41/61  bone]
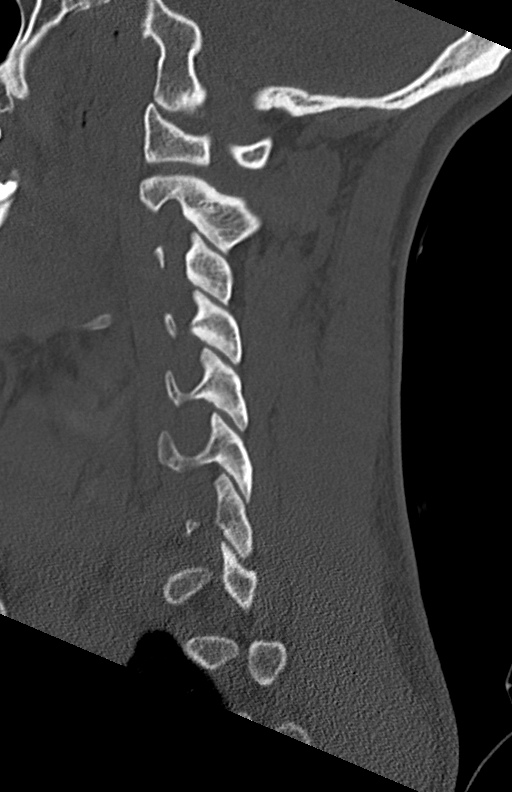

[Series 6: coronal bone · coronal · 0.22mm/px · 3 of 57 slices shown]
[im 12/57  bone]
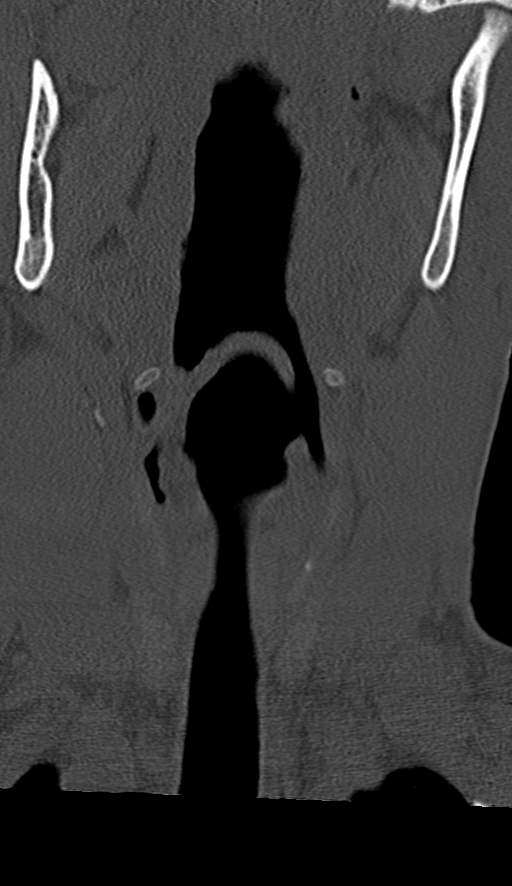
[im 23/57  bone]
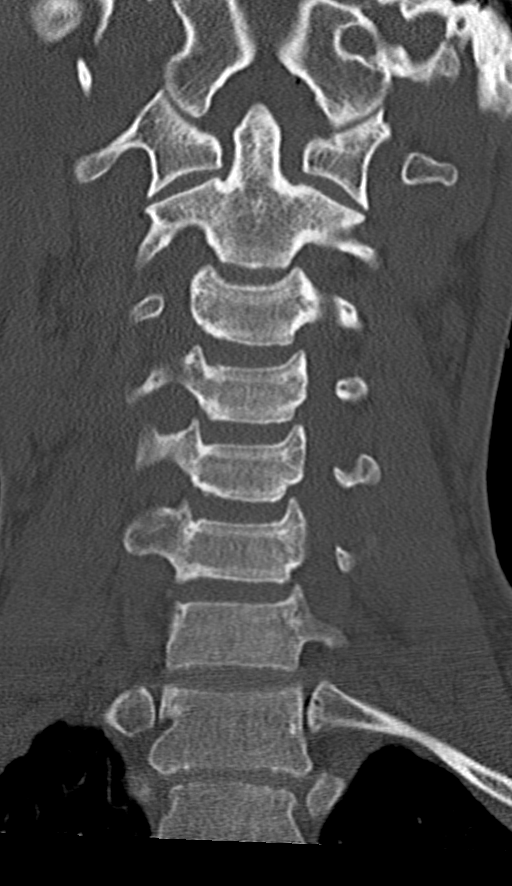
[im 34/57  bone]
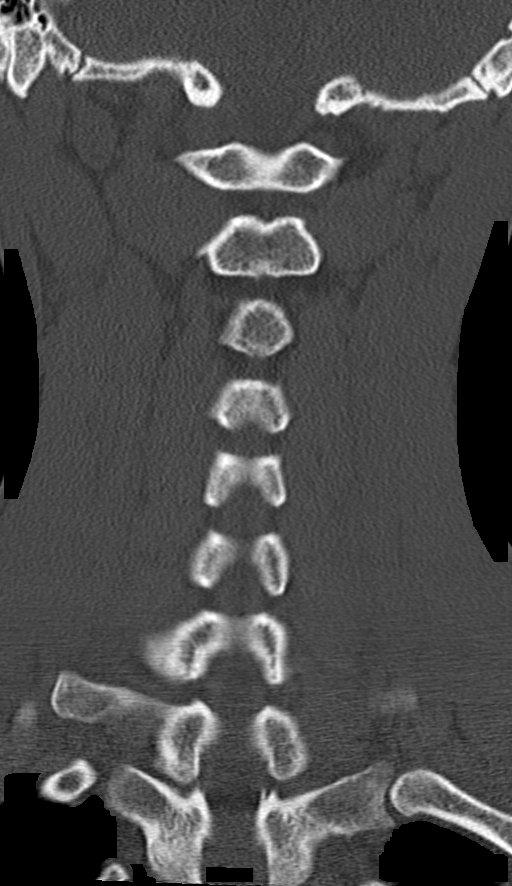

[Series 7: orthogonal axials · axial · 0.21mm/px · z∈[-26,+81]mm · 4 of 91 slices shown, 5 images]
[im 16/91  soft-tissue]
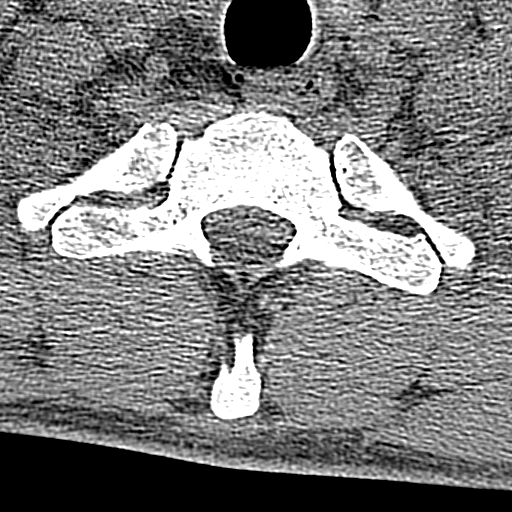
[im 16/91  bone]
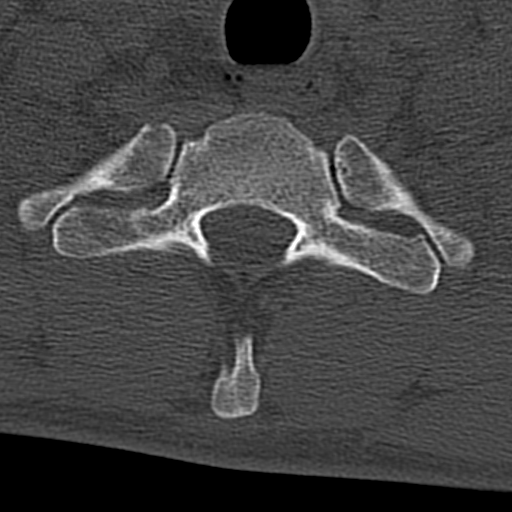
[im 31/91  bone]
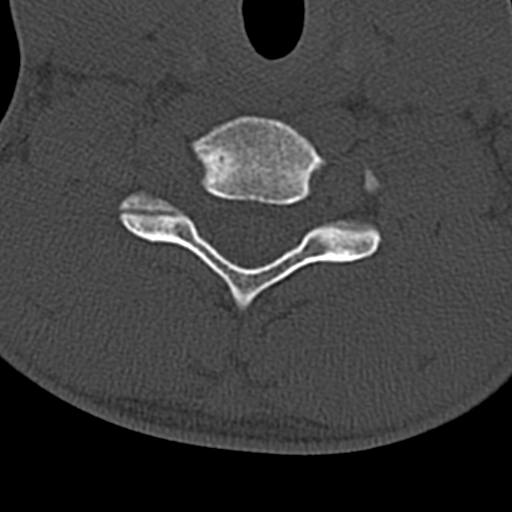
[im 61/91  bone]
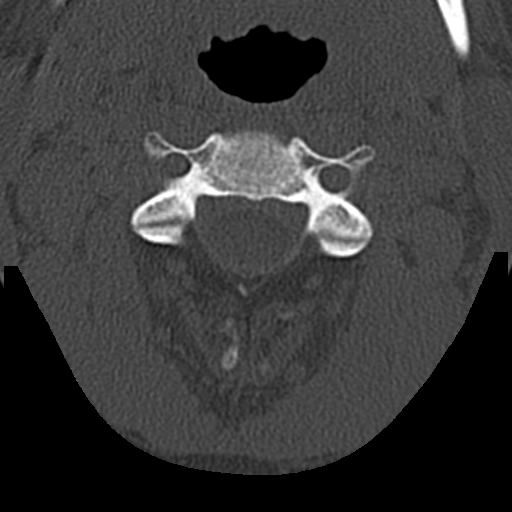
[im 76/91  bone]
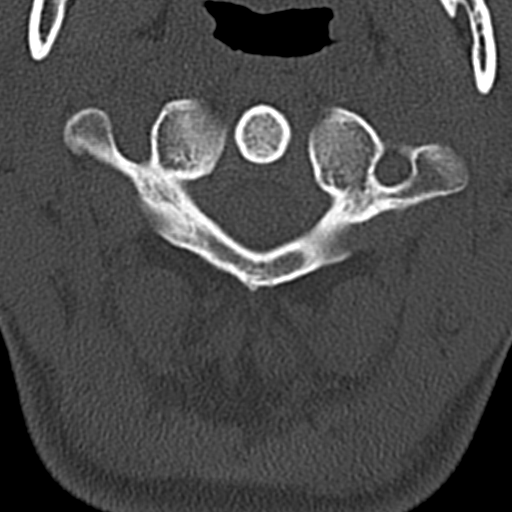

[12 of 33 positions shown; findings below may reference images not displayed]

FINDINGS: CT HEAD FINDINGS

Brain: Ventriculostomy catheter is again noted in place. No
ventricular dilatation is seen. No findings to suggest acute
hemorrhage, acute infarction or space-occupying mass lesion noted.

Vascular: No hyperdense vessel or unexpected calcification.

Skull: Normal. Negative for fracture or focal lesion.

Sinuses/Orbits: No acute finding.

Other: Small foci of air are noted within cavernous sinus likely
related to inadvertent air during IV initiation. These changes are
new from exam.

CT CERVICAL SPINE FINDINGS

Alignment: Within normal limits

Skull base and vertebrae: 7 cervical segments are well visualized.
Vertebral body height is well maintained. No acute fracture or acute
facet abnormality is noted.

Soft tissues and spinal canal: Surrounding soft tissue structures
show no acute abnormality. The known ventricular shunt catheter is
seen in the right neck.

Upper chest: Visualized lung apices are within normal limits.

Other: None
IMPRESSION: CT of the head: No acute abnormality noted.

Ventriculostomy catheter in place.

CT of the cervical spine: No acute abnormality noted.
